# Patient Record
Sex: Female | Born: 1970 | Race: White | Hispanic: No | Marital: Married | Smoking: Current every day smoker
Health system: Southern US, Community
[De-identification: ages and names within clinical notes are randomized; demographics above are authoritative.]

## PROBLEM LIST (undated history)

## (undated) DIAGNOSIS — Z9221 Personal history of antineoplastic chemotherapy: Secondary | ICD-10-CM

## (undated) DIAGNOSIS — C801 Malignant (primary) neoplasm, unspecified: Secondary | ICD-10-CM

## (undated) HISTORY — PX: APPENDECTOMY: SHX54

---

## 1990-04-26 HISTORY — PX: BREAST CYST EXCISION: SHX579

## 2006-06-21 ENCOUNTER — Ambulatory Visit: Payer: Self-pay | Admitting: Pain Medicine

## 2006-06-29 ENCOUNTER — Ambulatory Visit: Payer: Self-pay | Admitting: Pain Medicine

## 2009-01-11 ENCOUNTER — Ambulatory Visit: Payer: Self-pay | Admitting: Internal Medicine

## 2009-01-13 ENCOUNTER — Ambulatory Visit: Payer: Self-pay | Admitting: Family Medicine

## 2009-01-22 ENCOUNTER — Ambulatory Visit: Payer: Self-pay | Admitting: General Surgery

## 2009-01-31 ENCOUNTER — Other Ambulatory Visit: Payer: Self-pay | Admitting: General Surgery

## 2009-02-05 ENCOUNTER — Ambulatory Visit: Payer: Self-pay | Admitting: General Surgery

## 2009-02-24 ENCOUNTER — Ambulatory Visit: Payer: Self-pay | Admitting: Internal Medicine

## 2009-03-03 ENCOUNTER — Ambulatory Visit: Payer: Self-pay | Admitting: Internal Medicine

## 2009-12-30 ENCOUNTER — Ambulatory Visit: Payer: Self-pay | Admitting: Family Medicine

## 2010-04-09 ENCOUNTER — Ambulatory Visit: Payer: Self-pay | Admitting: Family Medicine

## 2010-04-20 ENCOUNTER — Ambulatory Visit: Payer: Self-pay | Admitting: Internal Medicine

## 2011-04-22 ENCOUNTER — Ambulatory Visit: Payer: Self-pay | Admitting: Family Medicine

## 2012-02-10 ENCOUNTER — Ambulatory Visit: Payer: Self-pay | Admitting: Family Medicine

## 2012-07-28 ENCOUNTER — Ambulatory Visit: Payer: Self-pay | Admitting: Family Medicine

## 2012-09-01 ENCOUNTER — Ambulatory Visit: Payer: Self-pay | Admitting: Family Medicine

## 2012-10-12 ENCOUNTER — Ambulatory Visit: Payer: Self-pay

## 2012-10-12 LAB — MONONUCLEOSIS SCREEN: Mono Test: NEGATIVE

## 2012-10-12 LAB — CBC WITH DIFFERENTIAL/PLATELET
Basophil #: 0 10*3/uL (ref 0.0–0.1)
Basophil %: 0.5 %
Eosinophil #: 0.2 10*3/uL (ref 0.0–0.7)
Eosinophil %: 2.2 %
HCT: 37.4 % (ref 35.0–47.0)
HGB: 12.7 g/dL (ref 12.0–16.0)
MCH: 30.8 pg (ref 26.0–34.0)
MCHC: 34.1 g/dL (ref 32.0–36.0)
MCV: 90 fL (ref 80–100)
Monocyte #: 0.5 x10 3/mm (ref 0.2–0.9)
Monocyte %: 5.6 %
Platelet: 177 10*3/uL (ref 150–440)

## 2012-11-16 ENCOUNTER — Emergency Department: Payer: Self-pay | Admitting: Emergency Medicine

## 2012-11-16 LAB — URINALYSIS, COMPLETE
Bacteria: NONE SEEN
Glucose,UR: NEGATIVE mg/dL (ref 0–75)
Ph: 5 (ref 4.5–8.0)
Protein: 100
WBC UR: 296 /HPF (ref 0–5)

## 2012-12-06 ENCOUNTER — Ambulatory Visit: Payer: Self-pay | Admitting: Family Medicine

## 2013-09-13 ENCOUNTER — Ambulatory Visit: Payer: Self-pay | Admitting: Family Medicine

## 2013-09-19 ENCOUNTER — Ambulatory Visit: Payer: Self-pay | Admitting: Family Medicine

## 2014-03-16 ENCOUNTER — Ambulatory Visit: Payer: Self-pay | Admitting: Emergency Medicine

## 2016-03-01 ENCOUNTER — Encounter (INDEPENDENT_AMBULATORY_CARE_PROVIDER_SITE_OTHER): Payer: Self-pay

## 2016-03-01 ENCOUNTER — Ambulatory Visit
Admission: RE | Admit: 2016-03-01 | Discharge: 2016-03-01 | Disposition: A | Discharge status: Home / Self Care | Payer: BLUE CROSS/BLUE SHIELD | Source: Ambulatory Visit | Attending: Family Medicine | Admitting: Family Medicine

## 2016-03-01 ENCOUNTER — Other Ambulatory Visit: Payer: Self-pay | Admitting: Family Medicine

## 2016-03-01 DIAGNOSIS — S43401A Unspecified sprain of right shoulder joint, initial encounter: Secondary | ICD-10-CM

## 2016-03-01 DIAGNOSIS — X58XXXA Exposure to other specified factors, initial encounter: Secondary | ICD-10-CM | POA: Diagnosis not present

## 2016-03-01 DIAGNOSIS — S4381XA Sprain of other specified parts of right shoulder girdle, initial encounter: Secondary | ICD-10-CM | POA: Insufficient documentation

## 2016-10-24 DIAGNOSIS — C801 Malignant (primary) neoplasm, unspecified: Secondary | ICD-10-CM

## 2016-10-24 HISTORY — PX: OTHER SURGICAL HISTORY: SHX169

## 2016-10-24 HISTORY — DX: Malignant (primary) neoplasm, unspecified: C80.1

## 2016-11-19 ENCOUNTER — Ambulatory Visit: Payer: Self-pay | Admitting: Obstetrics and Gynecology

## 2017-09-23 ENCOUNTER — Other Ambulatory Visit: Payer: Self-pay | Admitting: Family Medicine

## 2017-09-23 DIAGNOSIS — Z1231 Encounter for screening mammogram for malignant neoplasm of breast: Secondary | ICD-10-CM

## 2017-10-03 ENCOUNTER — Ambulatory Visit
Admission: RE | Admit: 2017-10-03 | Discharge: 2017-10-03 | Disposition: A | Discharge status: Home / Self Care | Payer: 59 | Source: Ambulatory Visit | Attending: Family Medicine | Admitting: Family Medicine

## 2017-10-03 DIAGNOSIS — Z1231 Encounter for screening mammogram for malignant neoplasm of breast: Secondary | ICD-10-CM

## 2017-10-03 HISTORY — DX: Personal history of antineoplastic chemotherapy: Z92.21

## 2017-10-03 HISTORY — DX: Malignant (primary) neoplasm, unspecified: C80.1

## 2017-10-05 ENCOUNTER — Encounter: Payer: Self-pay | Admitting: Obstetrics and Gynecology

## 2017-10-05 ENCOUNTER — Telehealth: Payer: Self-pay | Admitting: Obstetrics and Gynecology

## 2017-10-05 ENCOUNTER — Ambulatory Visit (INDEPENDENT_AMBULATORY_CARE_PROVIDER_SITE_OTHER): Payer: 59 | Admitting: Obstetrics and Gynecology

## 2017-10-05 VITALS — BP 106/68 | HR 110 | Ht 67.0 in | Wt 171.0 lb

## 2017-10-05 DIAGNOSIS — Z01419 Encounter for gynecological examination (general) (routine) without abnormal findings: Secondary | ICD-10-CM | POA: Diagnosis not present

## 2017-10-05 DIAGNOSIS — Z1231 Encounter for screening mammogram for malignant neoplasm of breast: Secondary | ICD-10-CM

## 2017-10-05 DIAGNOSIS — N951 Menopausal and female climacteric states: Secondary | ICD-10-CM | POA: Diagnosis not present

## 2017-10-05 DIAGNOSIS — Z124 Encounter for screening for malignant neoplasm of cervix: Secondary | ICD-10-CM | POA: Diagnosis not present

## 2017-10-05 DIAGNOSIS — Z1239 Encounter for other screening for malignant neoplasm of breast: Secondary | ICD-10-CM

## 2017-10-05 NOTE — Telephone Encounter (Signed)
Patient scheduled 6/19 for  mirena replacement with AMS

## 2017-10-05 NOTE — Patient Instructions (Signed)
Preventive Care 40-64 Years, Female Preventive care refers to lifestyle choices and visits with your health care provider that can promote health and wellness. What does preventive care include?  A yearly physical exam. This is also called an annual well check.  Dental exams once or twice a year.  Routine eye exams. Ask your health care provider how often you should have your eyes checked.  Personal lifestyle choices, including: ? Daily care of your teeth and gums. ? Regular physical activity. ? Eating a healthy diet. ? Avoiding tobacco and drug use. ? Limiting alcohol use. ? Practicing safe sex. ? Taking low-dose aspirin daily starting at age 58. ? Taking vitamin and mineral supplements as recommended by your health care provider. What happens during an annual well check? The services and screenings done by your health care provider during your annual well check will depend on your age, overall health, lifestyle risk factors, and family history of disease. Counseling Your health care provider may ask you questions about your:  Alcohol use.  Tobacco use.  Drug use.  Emotional well-being.  Home and relationship well-being.  Sexual activity.  Eating habits.  Work and work Statistician.  Method of birth control.  Menstrual cycle.  Pregnancy history.  Screening You may have the following tests or measurements:  Height, weight, and BMI.  Blood pressure.  Lipid and cholesterol levels. These may be checked every 5 years, or more frequently if you are over 81 years old.  Skin check.  Lung cancer screening. You may have this screening every year starting at age 78 if you have a 30-pack-year history of smoking and currently smoke or have quit within the past 15 years.  Fecal occult blood test (FOBT) of the stool. You may have this test every year starting at age 65.  Flexible sigmoidoscopy or colonoscopy. You may have a sigmoidoscopy every 5 years or a colonoscopy  every 10 years starting at age 30.  Hepatitis C blood test.  Hepatitis B blood test.  Sexually transmitted disease (STD) testing.  Diabetes screening. This is done by checking your blood sugar (glucose) after you have not eaten for a while (fasting). You may have this done every 1-3 years.  Mammogram. This may be done every 1-2 years. Talk to your health care provider about when you should start having regular mammograms. This may depend on whether you have a family history of breast cancer.  BRCA-related cancer screening. This may be done if you have a family history of breast, ovarian, tubal, or peritoneal cancers.  Pelvic exam and Pap test. This may be done every 3 years starting at age 80. Starting at age 36, this may be done every 5 years if you have a Pap test in combination with an HPV test.  Bone density scan. This is done to screen for osteoporosis. You may have this scan if you are at high risk for osteoporosis.  Discuss your test results, treatment options, and if necessary, the need for more tests with your health care provider. Vaccines Your health care provider may recommend certain vaccines, such as:  Influenza vaccine. This is recommended every year.  Tetanus, diphtheria, and acellular pertussis (Tdap, Td) vaccine. You may need a Td booster every 10 years.  Varicella vaccine. You may need this if you have not been vaccinated.  Zoster vaccine. You may need this after age 5.  Measles, mumps, and rubella (MMR) vaccine. You may need at least one dose of MMR if you were born in  1957 or later. You may also need a second dose.  Pneumococcal 13-valent conjugate (PCV13) vaccine. You may need this if you have certain conditions and were not previously vaccinated.  Pneumococcal polysaccharide (PPSV23) vaccine. You may need one or two doses if you smoke cigarettes or if you have certain conditions.  Meningococcal vaccine. You may need this if you have certain  conditions.  Hepatitis A vaccine. You may need this if you have certain conditions or if you travel or work in places where you may be exposed to hepatitis A.  Hepatitis B vaccine. You may need this if you have certain conditions or if you travel or work in places where you may be exposed to hepatitis B.  Haemophilus influenzae type b (Hib) vaccine. You may need this if you have certain conditions.  Talk to your health care provider about which screenings and vaccines you need and how often you need them. This information is not intended to replace advice given to you by your health care provider. Make sure you discuss any questions you have with your health care provider. Document Released: 05/09/2015 Document Revised: 12/31/2015 Document Reviewed: 02/11/2015 Elsevier Interactive Patient Education  2018 [employer]  

## 2017-10-05 NOTE — Progress Notes (Signed)
Gynecology Annual Exam  PCP: Lynnell Jude, MD  Chief Complaint:  Chief Complaint  Patient presents with  . Gynecologic Exam    History of Present Illness: Patient is a 47 y.o. No obstetric history on file. presents for annual exam. 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 (Menstrual status: IUD). No menses secondary to IUD.  The patient is sexually active. She currently uses IUD for contraception. She denies dyspareunia.  The patient does perform self breast exams.  There is notable family history of breast or ovarian cancer in her family.  The patient wears seatbelts: yes.   The patient has regular exercise: not asked.    The patient denies current symptoms of depression.    Review of Systems: ROS  Past Medical History:  Past Medical History:  Diagnosis Date  . Cancer ([place]) 10/24/2016   appendix  . Personal history of chemotherapy     Past Surgical History:  Past Surgical History:  Procedure Laterality Date  . APPENDECTOMY    . BREAST CYST EXCISION Left 1992   neg  . Right Hemicholectomy  10/2016    Gynecologic History:  No LMP recorded. (Menstrual status: IUD). Contraception: IUD (Mirena 08/05/2011) Last Pap: Results were:09/05/2013 NIL and HR HPV negative  Last mammogram: 09/02/2017 Results were: BI-RAD I  Obstetric History: No obstetric history on file.  Family History:  Family History  Problem Relation Age of Onset  . Breast cancer Paternal Aunt 97  . Breast cancer Cousin        mat cousin  . Liver cancer Mother 89  . Thyroid cancer Brother 56  . BRCA 1/2 Neg Hx     Social History:  Social History   Socioeconomic History  . Marital status: Married    Spouse name: Not on file  . Number of children: Not on file  . Years of education: Not on file  . Highest education level: Not on file    Occupational History  . Not on file  Social Needs  . Financial resource strain: Not on file  . Food insecurity:    Worry: Not on file    Inability: Not on file  . Transportation needs:    Medical: Not on file    Non-medical: Not on file  Tobacco Use  . Smoking status: Current Every Day Smoker  . Smokeless tobacco: Never Used  Substance and Sexual Activity  . Alcohol use: Never    Frequency: Never  . Drug use: Never  . Sexual activity: Yes    Partners: Male    Birth control/protection: IUD  Lifestyle  . Physical activity:    Days per week: 0 days    Minutes per session: 0 min  . Stress: Not on file  Relationships  . Social connections:    Talks on phone: Not on file    Gets together: Not on file    Attends religious service: Not on file    Active member of club or organization: Not on file    Attends meetings of clubs or organizations: Not on file    Relationship status: Not on file  . Intimate partner violence:    Fear of current or ex partner: Not on file    Emotionally abused: Not on file    Physically abused: Not on file    Forced sexual activity: Not on file  Other Topics Concern  .  Not on file  Social History Narrative  . Not on file    Allergies:  No Known Allergies  Medications: Prior to Admission medications   Medication Sig Start Date End Date Taking? Authorizing Provider  acetaminophen (TYLENOL) 500 MG tablet Take by mouth. 12/02/16  Yes [provider]  escitalopram (LEXAPRO) 20 MG tablet Take by mouth.   Yes [provider]  HYDROcodone-acetaminophen (NORCO) 10-325 MG tablet  08/12/17  Yes [provider]  LC-5 LIDOCAINE 5 % CREA  10/04/17  Yes [provider]  levothyroxine (SYNTHROID, LEVOTHROID) 75 MCG tablet Take by mouth.   Yes [provider]  traMADol (ULTRAM) 50 MG tablet Take by mouth.   Yes [provider]  zolpidem (AMBIEN) 10 MG tablet  09/14/17  Yes [provider]     Physical Exam Vitals: Blood pressure 106/68, pulse (!) 110, height '5\' 7"'$  (1.702 m), weight 171 lb (77.6 kg).  General: NAD HEENT: normocephalic, anicteric Thyroid: no enlargement, no palpable nodules Pulmonary: No increased work of breathing, CTAB Cardiovascular: RRR, distal pulses 2+ Breast: Breast symmetrical, no tenderness, no palpable nodules or masses, no skin or nipple retraction present, no nipple discharge.  No axillary or supraclavicular lymphadenopathy. Abdomen: NABS, soft, non-tender, non-distended.  Umbilicus without lesions.  No hepatomegaly, splenomegaly or masses palpable. No evidence of hernia  Genitourinary:  External: Normal external female genitalia.  Normal urethral meatus, normal Bartholin's and Skene's glands.    Vagina: Normal vaginal mucosa, no evidence of prolapse.    Cervix: Grossly normal in appearance, no bleeding  Uterus: Non-enlarged, mobile, normal contour.  No CMT  Adnexa: ovaries non-enlarged, no adnexal masses  Rectal: deferred  Lymphatic: no evidence of inguinal lymphadenopathy Extremities: no edema, erythema, or tenderness Neurologic: Grossly intact Psychiatric: mood appropriate, affect full  Female chaperone present for pelvic and breast  portions of the physical exam    Assessment: 47 y.o. No obstetric history on file. routine annual exam  Plan: Problem List Items Addressed This Visit    None    Visit Diagnoses    Breast screening    -  Primary   Encounter for gynecological examination without abnormal finding       Relevant Orders   Follicle stimulating hormone   Estradiol   PapIG, HPV, rfx 16/18   Screening for malignant neoplasm of cervix       Relevant Orders   PapIG, HPV, rfx 16/18   Perimenopausal symptoms       Relevant Orders   Follicle stimulating hormone   Estradiol      1) Mammogram - recommend yearly screening mammogram.  Mammogram Is up to date - Patient has had prior BRCA 1 & 2 testing which were negative -  Given handout on [employer] and discussed extended panel testing given her personal as well as family history of cancer  2) STI screening  was notoffered and therefore not obtained  3) ASCCP guidelines and rational discussed.  Patient opts for every 3 years screening interval  4) Contraception - the patient is currently using  IUD.  She is happy with her current form of contraception and plans to continue - Would like to have exchanged - Discussed likely menopausal given age and chemotherapy effects - Will check FSH/estradiol prior to removal  5) Colonoscopy -- being followed with short interval screening colonoscopies following her surgery.  First scheduled for later this summer 1 year post right hemicolectomy.  6) Routine healthcare maintenance including cholesterol, diabetes screening discussed managed by  PCP  7) Return in about 1 week (around 10/12/2017) for Mirena removal and replacement.   Malachy Mood, MD, [facility], [facility] 10/05/2017, 1:37 PM

## 2017-10-06 LAB — ESTRADIOL: Estradiol: 53.5 pg/mL

## 2017-10-06 LAB — FOLLICLE STIMULATING HORMONE: FSH: 59.6 m[IU]/mL

## 2017-10-08 LAB — PAPIG, HPV, RFX 16/18
HPV, HIGH-RISK: NEGATIVE
PAP SMEAR COMMENT: 0

## 2017-10-12 ENCOUNTER — Encounter: Payer: Self-pay | Admitting: Obstetrics and Gynecology

## 2017-10-12 ENCOUNTER — Ambulatory Visit (INDEPENDENT_AMBULATORY_CARE_PROVIDER_SITE_OTHER): Payer: 59 | Admitting: Obstetrics and Gynecology

## 2017-10-12 VITALS — BP 128/68 | HR 106 | Wt 174.0 lb

## 2017-10-12 DIAGNOSIS — Z30433 Encounter for removal and reinsertion of intrauterine contraceptive device: Secondary | ICD-10-CM | POA: Diagnosis not present

## 2017-10-12 NOTE — Progress Notes (Signed)
    GYNECOLOGY OFFICE PROCEDURE NOTE  Helvi Royals is a 47 y.o. G0P0000 here for IUD removal and reinsertion. The patient currently has a MirenaIUD placed 6 years ago, which will be replaced with a Mirena IUD today.  No GYN concerns.  Last pap smear was on 10/05/17 and was normal.  IUD Removal and Reinsertion  Patient identified, informed consent performed, consent signed.   Discussed risks of irregular bleeding, cramping, infection, malpositioning or uterine perforation of the IUD which may require further procedures. Time out was performed. Speculum placed in the vagina. The strings of the IUD were grasped and pulled using ring forceps. The IUD was successfully removed in its entirety. The cervix was cleaned with Betadine x 2 and grasped anteriorly with a single tooth tenaculum.  The uterus was sounded to IUD insertion apparatus was used to sound the uterus to 8 cm using a uterine sound.  The IUD was then placed per manufacturer's recommendations. Strings trimmed to 3 cm. Tenaculum was removed, good hemostasis noted. Patient tolerated procedure well.   Patient was given post-procedure instructions.  Patient was also asked to check IUD strings periodically and follow up in 6 weeks for IUD check.   Malachy Mood, MD, [facility], [place]

## 2017-11-15 NOTE — Telephone Encounter (Signed)
Mirena rcvd/charged 10/12/17

## 2017-11-24 ENCOUNTER — Ambulatory Visit: Payer: 59 | Admitting: Obstetrics and Gynecology

## 2018-10-13 ENCOUNTER — Other Ambulatory Visit: Payer: Self-pay | Admitting: Family Medicine

## 2018-10-13 DIAGNOSIS — Z1231 Encounter for screening mammogram for malignant neoplasm of breast: Secondary | ICD-10-CM

## 2018-11-08 ENCOUNTER — Encounter (INDEPENDENT_AMBULATORY_CARE_PROVIDER_SITE_OTHER): Payer: Self-pay

## 2018-11-08 ENCOUNTER — Other Ambulatory Visit: Payer: Self-pay

## 2018-11-08 ENCOUNTER — Ambulatory Visit
Admission: RE | Admit: 2018-11-08 | Discharge: 2018-11-08 | Disposition: A | Discharge status: Home / Self Care | Payer: 59 | Source: Ambulatory Visit | Attending: Family Medicine | Admitting: Family Medicine

## 2018-11-08 DIAGNOSIS — Z1231 Encounter for screening mammogram for malignant neoplasm of breast: Secondary | ICD-10-CM

## 2019-01-24 ENCOUNTER — Ambulatory Visit: Payer: 59 | Admitting: Obstetrics and Gynecology

## 2019-10-04 ENCOUNTER — Other Ambulatory Visit: Payer: Self-pay | Admitting: Family Medicine

## 2019-10-04 DIAGNOSIS — Z1231 Encounter for screening mammogram for malignant neoplasm of breast: Secondary | ICD-10-CM

## 2019-10-16 ENCOUNTER — Inpatient Hospital Stay: Admission: RE | Admit: 2019-10-16 | Payer: 59 | Source: Ambulatory Visit

## 2019-12-08 IMAGING — MG DIGITAL SCREENING BILATERAL MAMMOGRAM WITH TOMO AND CAD
8 series · 8 of 24 positions shown · non-contrast
Comparison: Previous exam(s).

CLINICAL DATA: Screening.

EXAM:
DIGITAL SCREENING BILATERAL MAMMOGRAM WITH TOMO AND CAD

[R MLO synth-2D]
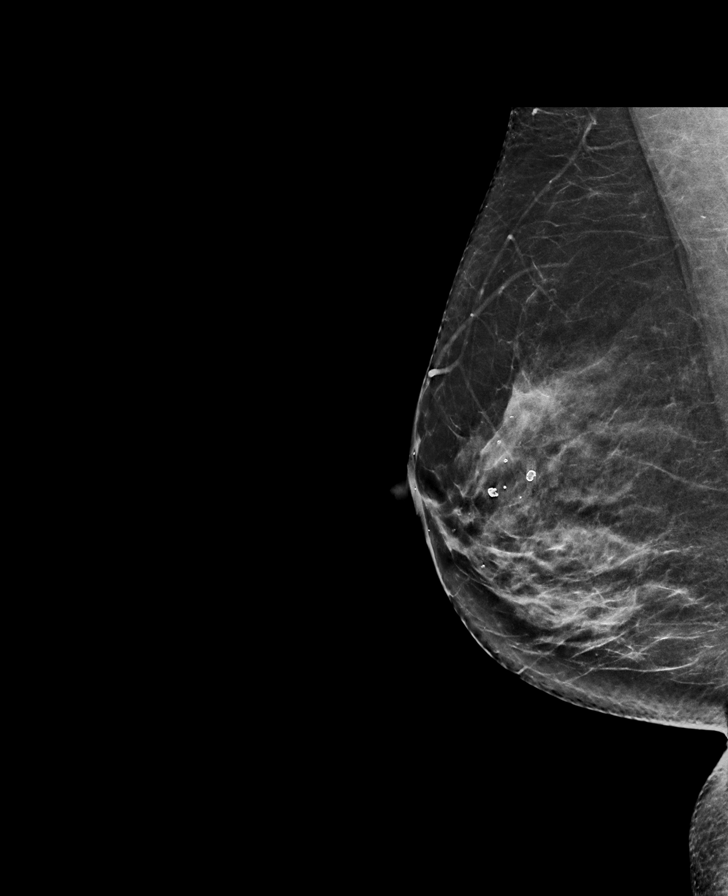

[L CC synth-2D]
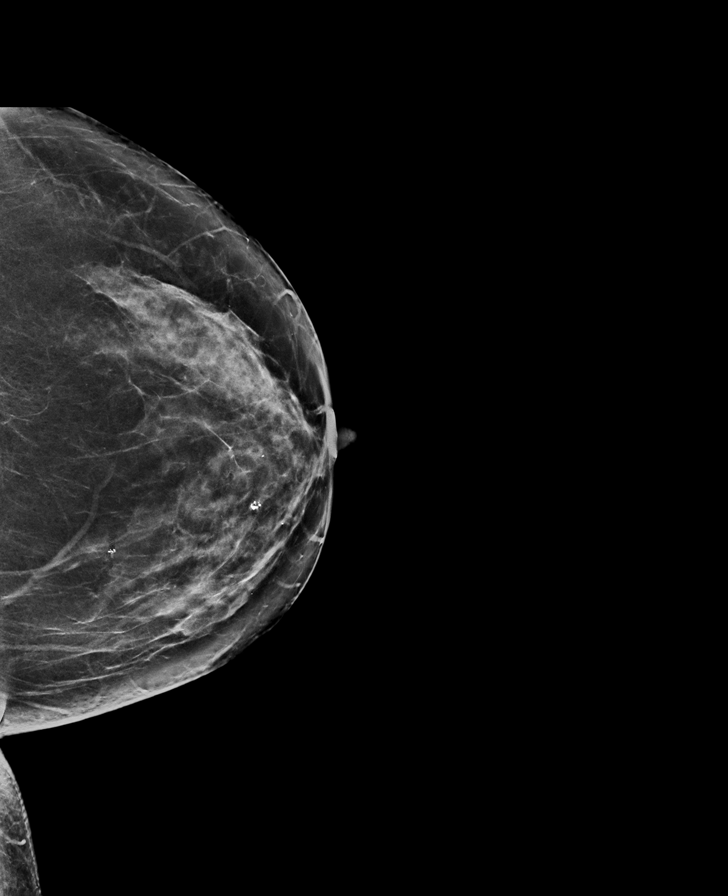

[L MLO synth-2D]
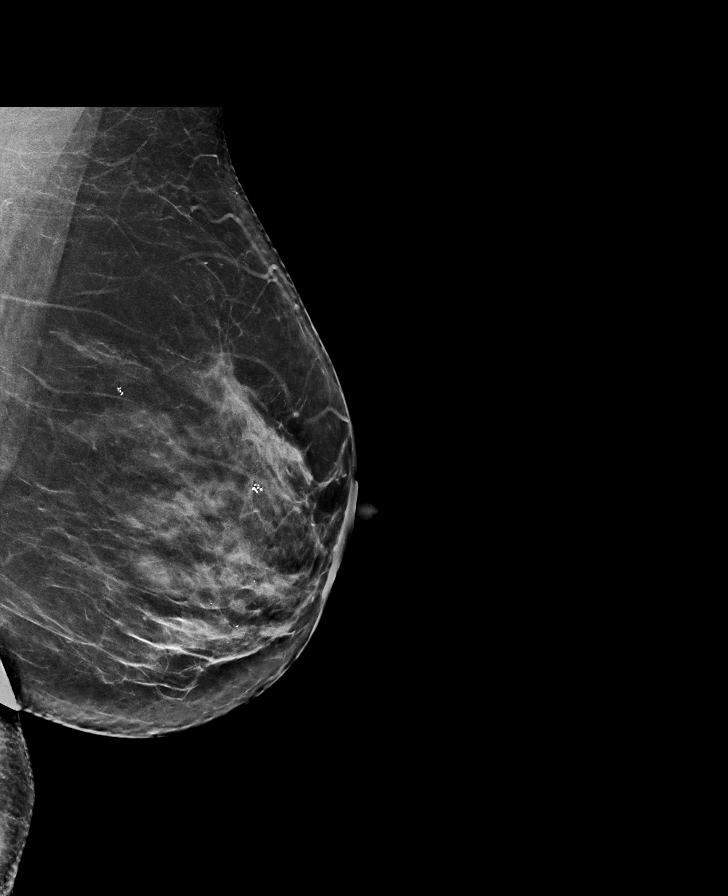

[R CC synth-2D]
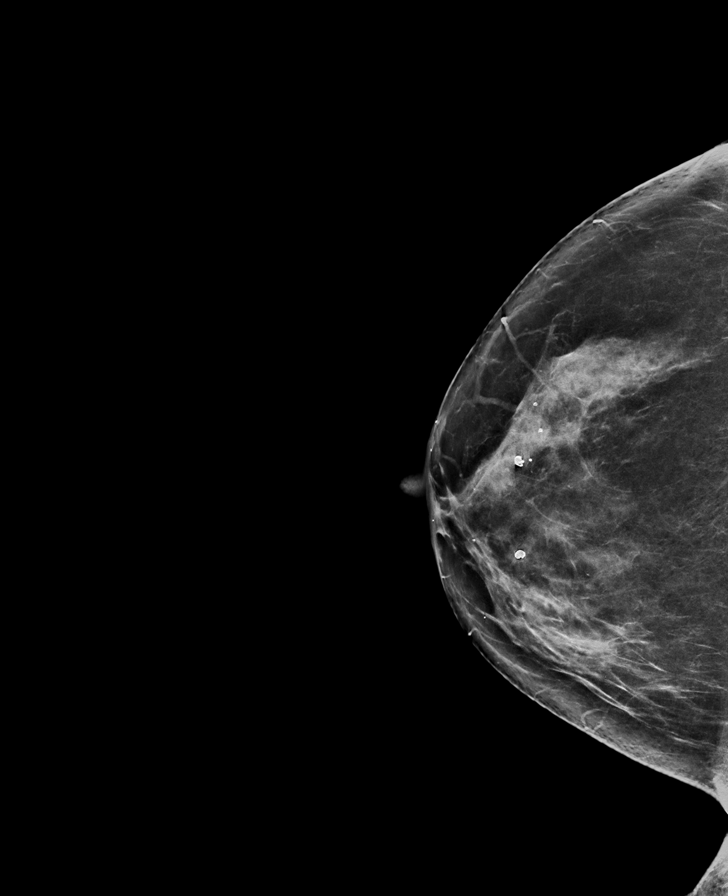

[L MLO tomo · tomo slice 39/77.0]
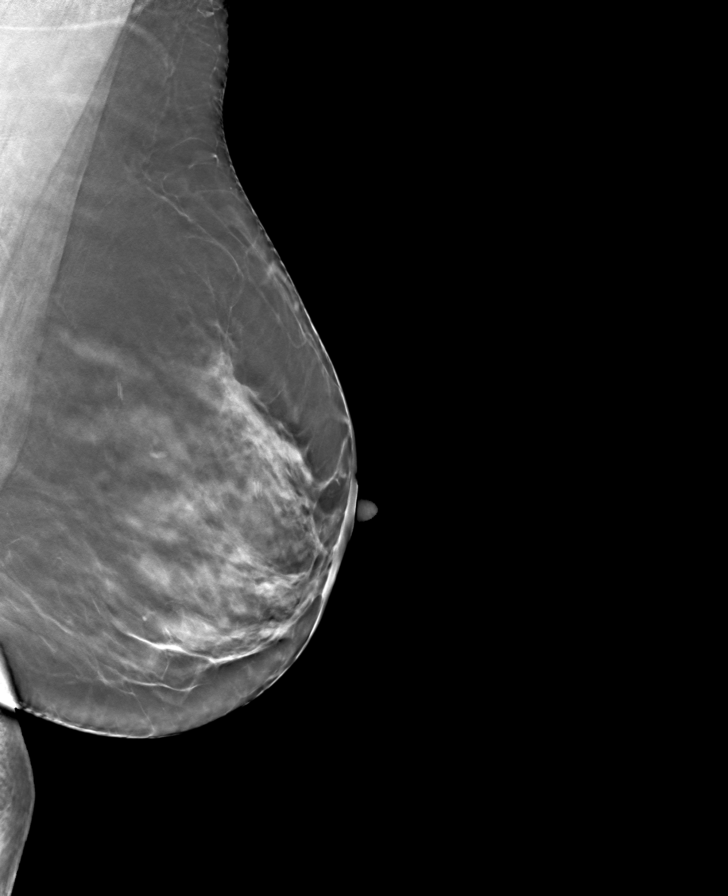

[L CC tomo · tomo slice 38/75.0]
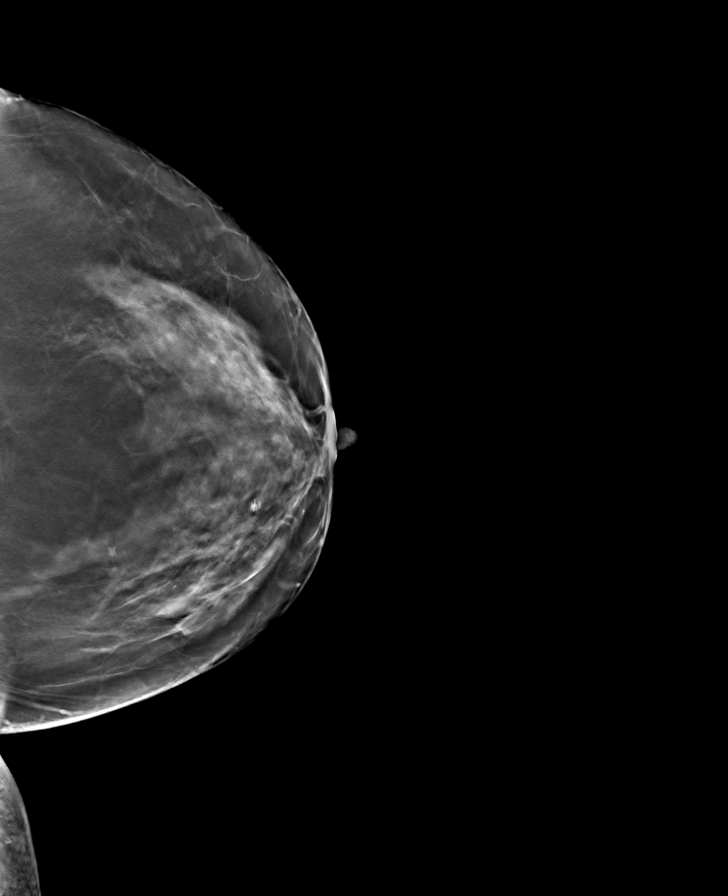

[R MLO tomo · tomo slice 37/73.0]
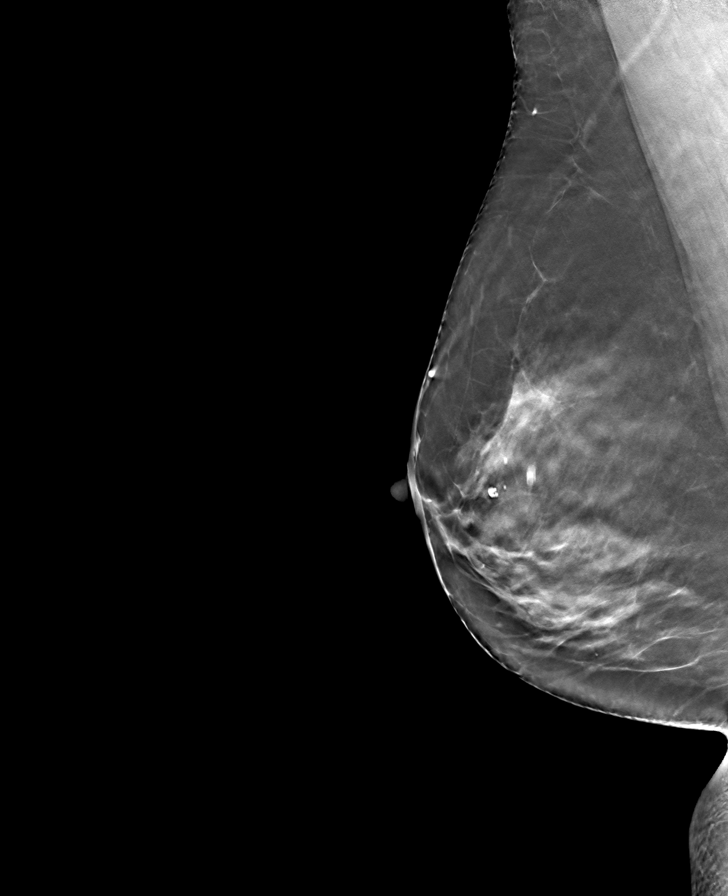

[R CC tomo · tomo slice 39/76.0]
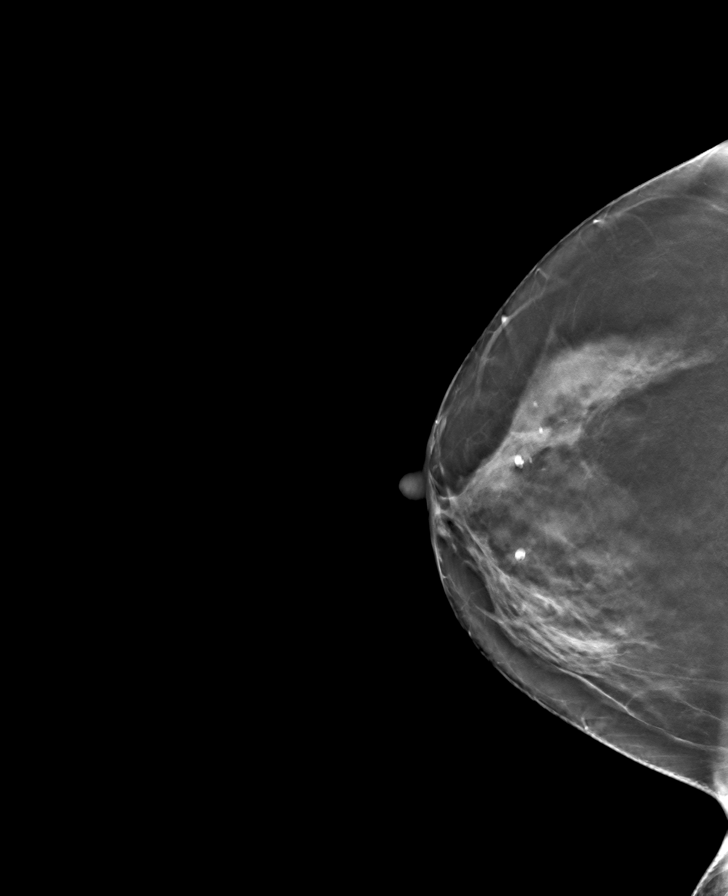

[8 of 24 positions shown; findings below may reference images not displayed]

ACR Breast Density Category c: The breast tissue is heterogeneously
dense, which may obscure small masses.
FINDINGS: There are no findings suspicious for malignancy. Images were
processed with CAD.
IMPRESSION: No mammographic evidence of malignancy. A result letter of this
screening mammogram will be mailed directly to the patient.

RECOMMENDATION:
Screening mammogram in one year. (Code:FT-U-LHB)

BI-RADS CATEGORY  1: Negative.

## 2020-03-24 ENCOUNTER — Other Ambulatory Visit: Payer: Self-pay

## 2020-03-24 DIAGNOSIS — Z20822 Contact with and (suspected) exposure to covid-19: Secondary | ICD-10-CM

## 2020-03-25 LAB — NOVEL CORONAVIRUS, NAA: SARS-CoV-2, NAA: NOT DETECTED

## 2020-03-25 LAB — SARS-COV-2, NAA 2 DAY TAT
